# Patient Record
Sex: Male | Born: 1985 | Race: Black or African American | Hispanic: No | Marital: Single | Smoking: Current every day smoker
Health system: Southern US, Community
[De-identification: ages and names within clinical notes are randomized; demographics above are authoritative.]

## PROBLEM LIST (undated history)

## (undated) HISTORY — PX: APPENDECTOMY: SHX54

---

## 2007-12-23 ENCOUNTER — Emergency Department: Payer: Self-pay | Admitting: Emergency Medicine

## 2011-01-18 ENCOUNTER — Emergency Department: Payer: Self-pay | Admitting: Unknown Physician Specialty

## 2011-11-09 ENCOUNTER — Emergency Department: Payer: Self-pay | Admitting: Emergency Medicine

## 2011-11-09 LAB — BASIC METABOLIC PANEL
Anion Gap: 7 (ref 7–16)
BUN: 13 mg/dL (ref 7–18)
Calcium, Total: 8.9 mg/dL (ref 8.5–10.1)
Chloride: 109 mmol/L — ABNORMAL HIGH (ref 98–107)
Creatinine: 0.86 mg/dL (ref 0.60–1.30)
EGFR (African American): >60
EGFR (Non-African Amer.): >60
Glucose: 90 mg/dL (ref 65–99)
Osmolality: 283 (ref 275–301)
Potassium: 4.2 mmol/L (ref 3.5–5.1)

## 2011-11-09 LAB — TROPONIN I: Troponin-I: <0.02 ng/mL

## 2011-11-09 LAB — CBC
HCT: 44.6 % (ref 40.0–52.0)
HGB: 15 g/dL (ref 13.0–18.0)
MCH: 28 pg (ref 26.0–34.0)
Platelet: 281 10*3/uL (ref 150–440)
RBC: 5.37 10*6/uL (ref 4.40–5.90)
WBC: 7.9 10*3/uL (ref 3.8–10.6)

## 2013-07-16 ENCOUNTER — Emergency Department: Payer: Self-pay | Admitting: Emergency Medicine

## 2013-07-16 LAB — CBC
HCT: 43.4 % (ref 40.0–52.0)
HGB: 14.5 g/dL (ref 13.0–18.0)
MCH: 27.7 pg (ref 26.0–34.0)
MCHC: 33.4 g/dL (ref 32.0–36.0)
MCV: 83 fL (ref 80–100)
Platelet: 296 10*3/uL (ref 150–440)
RBC: 5.21 10*6/uL (ref 4.40–5.90)
RDW: 13.6 % (ref 11.5–14.5)
WBC: 8.9 10*3/uL (ref 3.8–10.6)

## 2013-07-16 LAB — MONONUCLEOSIS SCREEN: MONO TEST: NEGATIVE

## 2013-07-18 LAB — BETA STREP CULTURE(ARMC)

## 2014-05-04 ENCOUNTER — Emergency Department: Payer: Self-pay | Admitting: Emergency Medicine

## 2014-05-04 LAB — COMPREHENSIVE METABOLIC PANEL
AST: 20 U/L
Albumin: 4.7 g/dL
Alkaline Phosphatase: 56 U/L
Anion Gap: 8 (ref 7–16)
BUN: 10 mg/dL
Bilirubin,Total: 0.6 mg/dL
Calcium, Total: 9.4 mg/dL
Chloride: 106 mmol/L
Co2: 27 mmol/L
Creatinine: 0.73 mg/dL
Glucose: 93 mg/dL
Potassium: 3.9 mmol/L
SGPT (ALT): 22 U/L
Sodium: 141 mmol/L
TOTAL PROTEIN: 8 g/dL

## 2014-05-04 LAB — CBC WITH DIFFERENTIAL/PLATELET
BASOS ABS: 0.1 10*3/uL (ref 0.0–0.1)
Basophil %: 0.9 %
EOS PCT: 1.4 %
Eosinophil #: 0.1 10*3/uL (ref 0.0–0.7)
HCT: 46.4 % (ref 40.0–52.0)
HGB: 14.9 g/dL (ref 13.0–18.0)
Lymphocyte #: 2.4 10*3/uL (ref 1.0–3.6)
Lymphocyte %: 42.2 %
MCH: 26.9 pg (ref 26.0–34.0)
MCHC: 32.2 g/dL (ref 32.0–36.0)
MCV: 84 fL (ref 80–100)
MONOS PCT: 5.9 %
Monocyte #: 0.3 x10 3/mm (ref 0.2–1.0)
NEUTROS ABS: 2.8 10*3/uL (ref 1.4–6.5)
Neutrophil %: 49.6 %
Platelet: 289 10*3/uL (ref 150–440)
RBC: 5.55 10*6/uL (ref 4.40–5.90)
RDW: 13.6 % (ref 11.5–14.5)
WBC: 5.7 10*3/uL (ref 3.8–10.6)

## 2014-05-04 LAB — LIPASE, BLOOD: Lipase: 17 U/L — ABNORMAL LOW

## 2014-05-04 LAB — TROPONIN I: Troponin-I: <0.03 ng/mL

## 2014-09-22 ENCOUNTER — Encounter: Payer: Self-pay | Admitting: Emergency Medicine

## 2014-09-22 ENCOUNTER — Emergency Department
Admission: EM | Admit: 2014-09-22 | Discharge: 2014-09-22 | Disposition: A | Discharge status: Home / Self Care | Payer: Medicaid Other | Attending: Emergency Medicine | Admitting: Emergency Medicine

## 2014-09-22 DIAGNOSIS — Z720 Tobacco use: Secondary | ICD-10-CM | POA: Diagnosis not present

## 2014-09-22 DIAGNOSIS — R197 Diarrhea, unspecified: Secondary | ICD-10-CM | POA: Insufficient documentation

## 2014-09-22 DIAGNOSIS — R109 Unspecified abdominal pain: Secondary | ICD-10-CM | POA: Diagnosis present

## 2014-09-22 DIAGNOSIS — R1084 Generalized abdominal pain: Secondary | ICD-10-CM | POA: Insufficient documentation

## 2014-09-22 LAB — CBC WITH DIFFERENTIAL/PLATELET
Basophils Absolute: 0.1 10*3/uL (ref 0–0.1)
Basophils Relative: 1 %
Eosinophils Absolute: 0.1 10*3/uL (ref 0–0.7)
Eosinophils Relative: 2 %
HEMATOCRIT: 43.5 % (ref 40.0–52.0)
Hemoglobin: 14.2 g/dL (ref 13.0–18.0)
LYMPHS ABS: 2.6 10*3/uL (ref 1.0–3.6)
LYMPHS PCT: 38 %
MCH: 27 pg (ref 26.0–34.0)
MCHC: 32.6 g/dL (ref 32.0–36.0)
MCV: 82.8 fL (ref 80.0–100.0)
MONO ABS: 0.5 10*3/uL (ref 0.2–1.0)
MONOS PCT: 7 %
NEUTROS ABS: 3.6 10*3/uL (ref 1.4–6.5)
Neutrophils Relative %: 52 %
Platelets: 256 10*3/uL (ref 150–440)
RBC: 5.25 MIL/uL (ref 4.40–5.90)
RDW: 13.9 % (ref 11.5–14.5)
WBC: 6.9 10*3/uL (ref 3.8–10.6)

## 2014-09-22 LAB — COMPREHENSIVE METABOLIC PANEL
ALK PHOS: 50 U/L (ref 38–126)
ALT: 18 U/L (ref 17–63)
ANION GAP: 8 (ref 5–15)
AST: 17 U/L (ref 15–41)
Albumin: 4.5 g/dL (ref 3.5–5.0)
BILIRUBIN TOTAL: 0.6 mg/dL (ref 0.3–1.2)
BUN: 12 mg/dL (ref 6–20)
CALCIUM: 8.9 mg/dL (ref 8.9–10.3)
CO2: 24 mmol/L (ref 22–32)
Chloride: 102 mmol/L (ref 101–111)
Creatinine, Ser: 0.83 mg/dL (ref 0.61–1.24)
GFR calc Af Amer: >60 mL/min (ref >60)
Glucose, Bld: 87 mg/dL (ref 65–99)
POTASSIUM: 3.9 mmol/L (ref 3.5–5.1)
Sodium: 134 mmol/L — ABNORMAL LOW (ref 135–145)
TOTAL PROTEIN: 7.6 g/dL (ref 6.5–8.1)

## 2014-09-22 NOTE — Discharge Instructions (Signed)
Abdominal Pain °Many things can cause abdominal pain. Usually, abdominal pain is not caused by a disease and will improve without treatment. It can often be observed and treated at home. Your health care provider will do a physical exam and possibly order blood tests and X-rays to help determine the seriousness of your pain. However, in many cases, more time must pass before a clear cause of the pain can be found. Before that point, your health care provider may not know if you need more testing or further treatment. °HOME CARE INSTRUCTIONS  °Monitor your abdominal pain for any changes. The following actions may help to alleviate any discomfort you are experiencing: °· Only take over-the-counter or prescription medicines as directed by your health care provider. °· Do not take laxatives unless directed to do so by your health care provider. °· Try a clear liquid diet (broth, tea, or water) as directed by your health care provider. Slowly move to a bland diet as tolerated. °SEEK MEDICAL CARE IF: °· You have unexplained abdominal pain. °· You have abdominal pain associated with nausea or diarrhea. °· You have pain when you urinate or have a bowel movement. °· You experience abdominal pain that wakes you in the night. °· You have abdominal pain that is worsened or improved by eating food. °· You have abdominal pain that is worsened with eating fatty foods. °· You have a fever. °SEEK IMMEDIATE MEDICAL CARE IF:  °· Your pain does not go away within 2 hours. °· You keep throwing up (vomiting). °· Your pain is felt only in portions of the abdomen, such as the right side or the left lower portion of the abdomen. °· You pass bloody or black tarry stools. °MAKE SURE YOU: °· Understand these instructions.   °· Will watch your condition.   °· Will get help right away if you are not doing well or get worse.   °Document Released: 11/01/2004 Document Revised: 01/27/2013 Document Reviewed: 10/01/2012 °[employer]® Patient Information  ©2015 [employer]. This information is not intended to replace advice given to you by your health care provider. Make sure you discuss any questions you have with your health care provider. ° °Diarrhea °Diarrhea is frequent loose and watery bowel movements. It can cause you to feel weak and dehydrated. Dehydration can cause you to become tired and thirsty, have a dry mouth, and have decreased urination that often is dark yellow. Diarrhea is a sign of another problem, most often an infection that will not last long. In most cases, diarrhea typically lasts 2-3 days. However, it can last longer if it is a sign of something more serious. It is important to treat your diarrhea as directed by your caregiver to lessen or prevent future episodes of diarrhea. °CAUSES  °Some common causes include: °· Gastrointestinal infections caused by viruses, bacteria, or parasites. °· Food poisoning or food allergies. °· Certain medicines, such as antibiotics, chemotherapy, and laxatives. °· Artificial sweeteners and fructose. °· Digestive disorders. °HOME CARE INSTRUCTIONS °· Ensure adequate fluid intake (hydration): Have 1 cup (8 oz) of fluid for each diarrhea episode. Avoid fluids that contain simple sugars or sports drinks, fruit juices, whole milk products, and sodas. Your urine should be clear or pale yellow if you are drinking enough fluids. Hydrate with an oral rehydration solution that you can purchase at pharmacies, retail stores, and online. You can prepare an oral rehydration solution at home by mixing the following ingredients together: °¨  - tsp table salt. °¨ ¾ tsp baking soda. °¨    tsp salt substitute containing potassium chloride. °¨ 1  tablespoons sugar. °¨ 1 L (34 oz) of water. °· Certain foods and beverages may increase the speed at which food moves through the gastrointestinal (GI) tract. These foods and beverages should be avoided and include: °¨ Caffeinated and alcoholic beverages. °¨ High-fiber foods, such as raw  fruits and vegetables, nuts, seeds, and whole grain breads and cereals. °¨ Foods and beverages sweetened with sugar alcohols, such as xylitol, sorbitol, and mannitol. °· Some foods may be well tolerated and may help thicken stool including: °¨ Starchy foods, such as rice, toast, pasta, low-sugar cereal, oatmeal, grits, baked potatoes, crackers, and bagels. °¨ Bananas. °¨ Applesauce. °· Add probiotic-rich foods to help increase healthy bacteria in the GI tract, such as yogurt and fermented milk products. °· Wash your hands well after each diarrhea episode. °· Only take over-the-counter or prescription medicines as directed by your caregiver. °· Take a warm bath to relieve any burning or pain from frequent diarrhea episodes. °SEEK IMMEDIATE MEDICAL CARE IF:  °· You are unable to keep fluids down. °· You have persistent vomiting. °· You have blood in your stool, or your stools are black and tarry. °· You do not urinate in 6-8 hours, or there is only a small amount of very dark urine. °· You have abdominal pain that increases or localizes. °· You have weakness, dizziness, confusion, or light-headedness. °· You have a severe headache. °· Your diarrhea gets worse or does not get better. °· You have a fever or persistent symptoms for more than 2-3 days. °· You have a fever and your symptoms suddenly get worse. °MAKE SURE YOU:  °· Understand these instructions. °· Will watch your condition. °· Will get help right away if you are not doing well or get worse. °Document Released: 01/12/2002 Document Revised: 06/08/2013 Document Reviewed: 09/30/2011 °[employer]® Patient Information ©2015 [employer]. This information is not intended to replace advice given to you by your health care provider. Make sure you discuss any questions you have with your health care provider. ° °

## 2014-09-22 NOTE — ED Notes (Signed)
Reports nausea and diarrhea since he woke up this am

## 2014-09-22 NOTE — ED Notes (Signed)
Today c/o diarrhea, abd pain, , no vomiting, abd pain has lessened

## 2014-09-22 NOTE — ED Provider Notes (Signed)
[facility] Emergency Department Provider Note  ____________________________________________  Time seen: Approximately 6 PM  I have reviewed the triage vital signs and the nursing notes.   HISTORY  Chief Complaint Nausea and Diarrhea    HPI Roberto Barrera is a 29 y.o. male without any medical problems who presents today with abdominal cramping and 5 episodes of diarrhea. He says that he thinks several other people at work and had this similar issue over the past week. No nausea or vomiting. No recent foreign travel, drinking from streams, antibiotics. Says that the pain has reduced and has no pain currently.He says that when the pain was present was diffuse and mild. Denies any pain to his penis or testicles. Denies any dysuria.   History reviewed. No pertinent past medical history.  There are no active problems to display for this patient.   Past Surgical History  Procedure Laterality Date  . Appendectomy      No current outpatient prescriptions on file.  Allergies Review of patient's allergies indicates no known allergies.  History reviewed. No pertinent family history.  Social History Social History  Substance Use Topics  . Smoking status: Current Every Day Smoker  . Smokeless tobacco: None  . Alcohol Use: No    Review of Systems Constitutional: No fever/chills Eyes: No visual changes. ENT: No sore throat. Cardiovascular: Denies chest pain. Respiratory: Denies shortness of breath. Gastrointestinal:  No nausea, no vomiting.   No constipation. Genitourinary: Negative for dysuria. Musculoskeletal: Negative for back pain. Skin: Negative for rash. Neurological: Negative for headaches, focal weakness or numbness.  10-point ROS otherwise negative.  ____________________________________________   PHYSICAL EXAM:  VITAL SIGNS: ED Triage Vitals  Enc Vitals Group     BP 09/22/14 1503 134/83 mmHg     Pulse Rate 09/22/14 1503 76     Resp  09/22/14 1503 20     Temp 09/22/14 1503 98.6 F (37 C)     Temp Source 09/22/14 1503 Oral     SpO2 09/22/14 1503 96 %     Weight 09/22/14 1503 300 lb (136.079 kg)     Height 09/22/14 1503  (1.854 m)     Head Cir --      Peak Flow --      Pain Score 09/22/14 1505 6     Pain Loc --      Pain Edu? --      Excl. in GC? --     Constitutional: Alert and oriented. Well appearing and in no acute distress. Eyes: Conjunctivae are normal. PERRL. EOMI. Head: Atraumatic. Nose: No congestion/rhinnorhea. Mouth/Throat: Mucous membranes are moist.  Oropharynx non-erythematous. Neck: No stridor.   Cardiovascular: Normal rate, regular rhythm. Grossly normal heart sounds.  Good peripheral circulation. Respiratory: Normal respiratory effort.  No retractions. Lungs CTAB. Gastrointestinal: Soft and nontender. No distention. No abdominal bruits. No CVA tenderness. Musculoskeletal: No lower extremity tenderness nor edema.  No joint effusions. Neurologic:  Normal speech and language. No gross focal neurologic deficits are appreciated. No gait instability. Skin:  Skin is warm, dry and intact. No rash noted. Psychiatric: Mood and affect are normal. Speech and behavior are normal.  ____________________________________________   LABS (all labs ordered are listed, but only abnormal results are displayed)  Labs Reviewed  COMPREHENSIVE METABOLIC PANEL - Abnormal; Notable for the following:    Sodium 134 (*)    All other components within normal limits  CBC WITH DIFFERENTIAL/PLATELET   ____________________________________________  EKG   ____________________________________________  RADIOLOGY  ____________________________________________   PROCEDURES    ____________________________________________   INITIAL IMPRESSION / ASSESSMENT AND PLAN / ED COURSE  Pertinent labs & imaging results that were available during my care of the patient were reviewed by me and considered in my medical  decision making (see chart for details).  Patient with likely viral illness. Reassuring labs as well as history and physical. We'll discharge to home. ____________________________________________   FINAL CLINICAL IMPRESSION(S) / ED DIAGNOSES  Acute diarrhea. Acute abdominal pain. Initial visit.    Myrna Blazer, MD 09/22/14 [phone]

## 2019-10-05 ENCOUNTER — Other Ambulatory Visit: Payer: Self-pay

## 2019-10-05 ENCOUNTER — Emergency Department
Admission: EM | Admit: 2019-10-05 | Discharge: 2019-10-05 | Disposition: A | Discharge status: Home / Self Care | Payer: Medicaid Other | Attending: Emergency Medicine | Admitting: Emergency Medicine

## 2019-10-05 ENCOUNTER — Emergency Department: Payer: Medicaid Other

## 2019-10-05 ENCOUNTER — Encounter: Payer: Self-pay | Admitting: Emergency Medicine

## 2019-10-05 DIAGNOSIS — R0789 Other chest pain: Secondary | ICD-10-CM | POA: Insufficient documentation

## 2019-10-05 DIAGNOSIS — R42 Dizziness and giddiness: Secondary | ICD-10-CM | POA: Insufficient documentation

## 2019-10-05 DIAGNOSIS — R05 Cough: Secondary | ICD-10-CM | POA: Insufficient documentation

## 2019-10-05 DIAGNOSIS — R0602 Shortness of breath: Secondary | ICD-10-CM | POA: Insufficient documentation

## 2019-10-05 DIAGNOSIS — Z5321 Procedure and treatment not carried out due to patient leaving prior to being seen by health care provider: Secondary | ICD-10-CM | POA: Insufficient documentation

## 2019-10-05 LAB — COMPREHENSIVE METABOLIC PANEL
ALT: 38 U/L (ref 0–44)
AST: 21 U/L (ref 15–41)
Albumin: 4.2 g/dL (ref 3.5–5.0)
Alkaline Phosphatase: 55 U/L (ref 38–126)
Anion gap: 8 (ref 5–15)
BUN: 10 mg/dL (ref 6–20)
CO2: 26 mmol/L (ref 22–32)
Calcium: 8.7 mg/dL — ABNORMAL LOW (ref 8.9–10.3)
Chloride: 102 mmol/L (ref 98–111)
Creatinine, Ser: 0.8 mg/dL (ref 0.61–1.24)
GFR calc Af Amer: >60 mL/min (ref >60)
GFR calc non Af Amer: >60 mL/min (ref >60)
Glucose, Bld: 119 mg/dL — ABNORMAL HIGH (ref 70–99)
Potassium: 3.5 mmol/L (ref 3.5–5.1)
Sodium: 136 mmol/L (ref 135–145)
Total Bilirubin: 0.8 mg/dL (ref 0.3–1.2)
Total Protein: 8 g/dL (ref 6.5–8.1)

## 2019-10-05 LAB — CBC
HCT: 42.9 % (ref 39.0–52.0)
Hemoglobin: 14.8 g/dL (ref 13.0–17.0)
MCH: 27.7 pg (ref 26.0–34.0)
MCHC: 34.5 g/dL (ref 30.0–36.0)
MCV: 80.3 fL (ref 80.0–100.0)
Platelets: 283 10*3/uL (ref 150–400)
RBC: 5.34 MIL/uL (ref 4.22–5.81)
RDW: 13.4 % (ref 11.5–15.5)
WBC: 10.5 10*3/uL (ref 4.0–10.5)
nRBC: 0 % (ref 0.0–0.2)

## 2019-10-05 LAB — TROPONIN I (HIGH SENSITIVITY): Troponin I (High Sensitivity): 9 ng/L (ref <18)

## 2019-10-05 NOTE — ED Notes (Signed)
No answer when called several times from lobby 

## 2019-10-05 NOTE — ED Triage Notes (Signed)
Here for [facility] and difficulty taking deep breath. Has also had pain in chest but feels it is from coughing. Low grade temp in triage.  unlabored at this time.  Had covid test done but waiting on results.  NAD at this time.

## 2019-10-06 ENCOUNTER — Telehealth: Payer: Self-pay | Admitting: Emergency Medicine

## 2019-10-06 NOTE — Telephone Encounter (Signed)
Called patient due to lwot to inquire about condition and follow up plans. He says he is a little better.  I told him that cxr is suspicious for pneumonia and that he really needs to have exam in order to determine what it is.  I told him about urgent cares and that they could see him for this.  He says he will go to urgent care.

## 2022-03-14 IMAGING — CR DG CHEST 2V
1 series · 2 of 2 positions shown · non-contrast
Comparison: 11/09/2011

CLINICAL DATA: Cough, shortness of breath

EXAM:
CHEST - 2 VIEW

[Series 1: dg chest 2 view · 0.14mm/px · 2 of 2 slices shown]
[im 1/2]
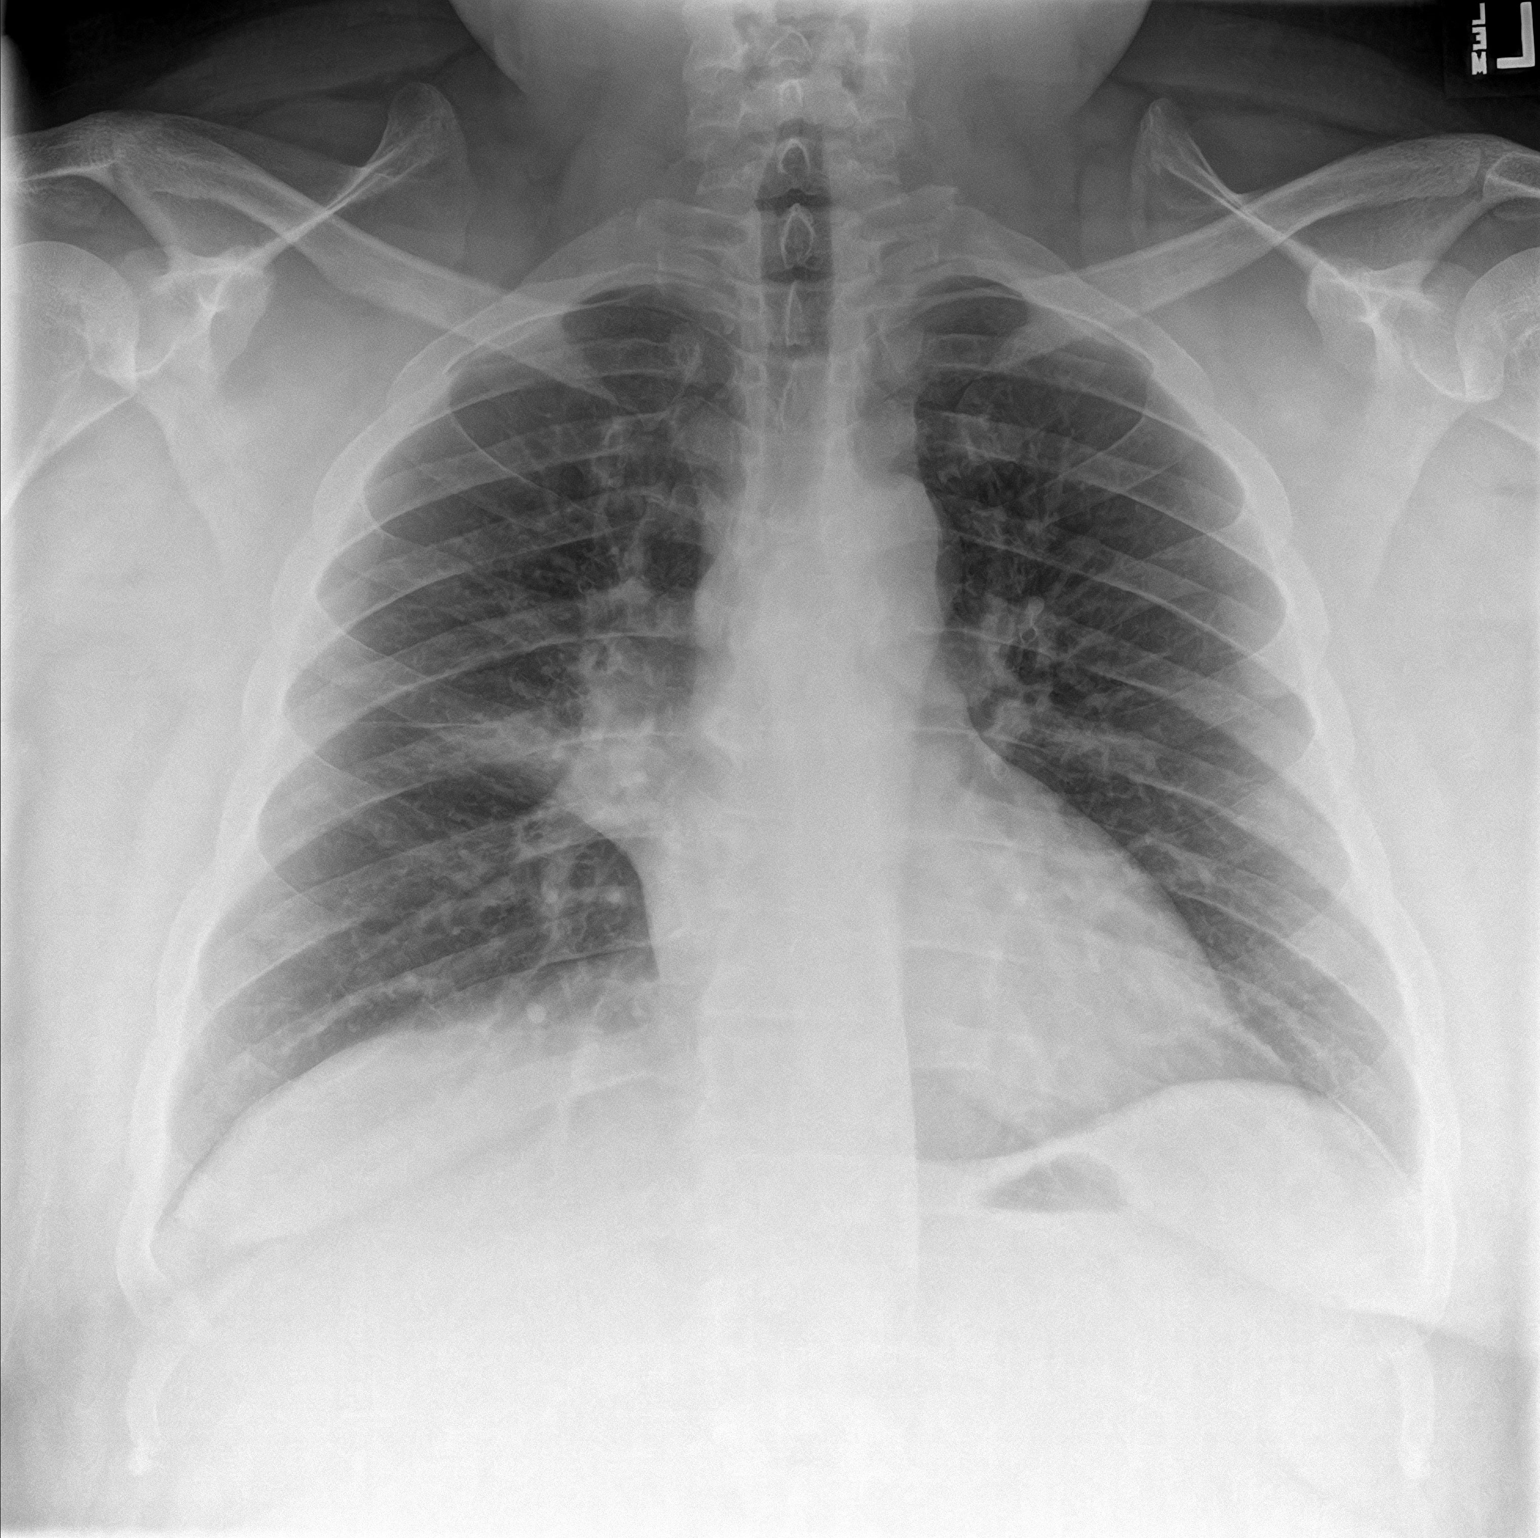
[im 2/2]
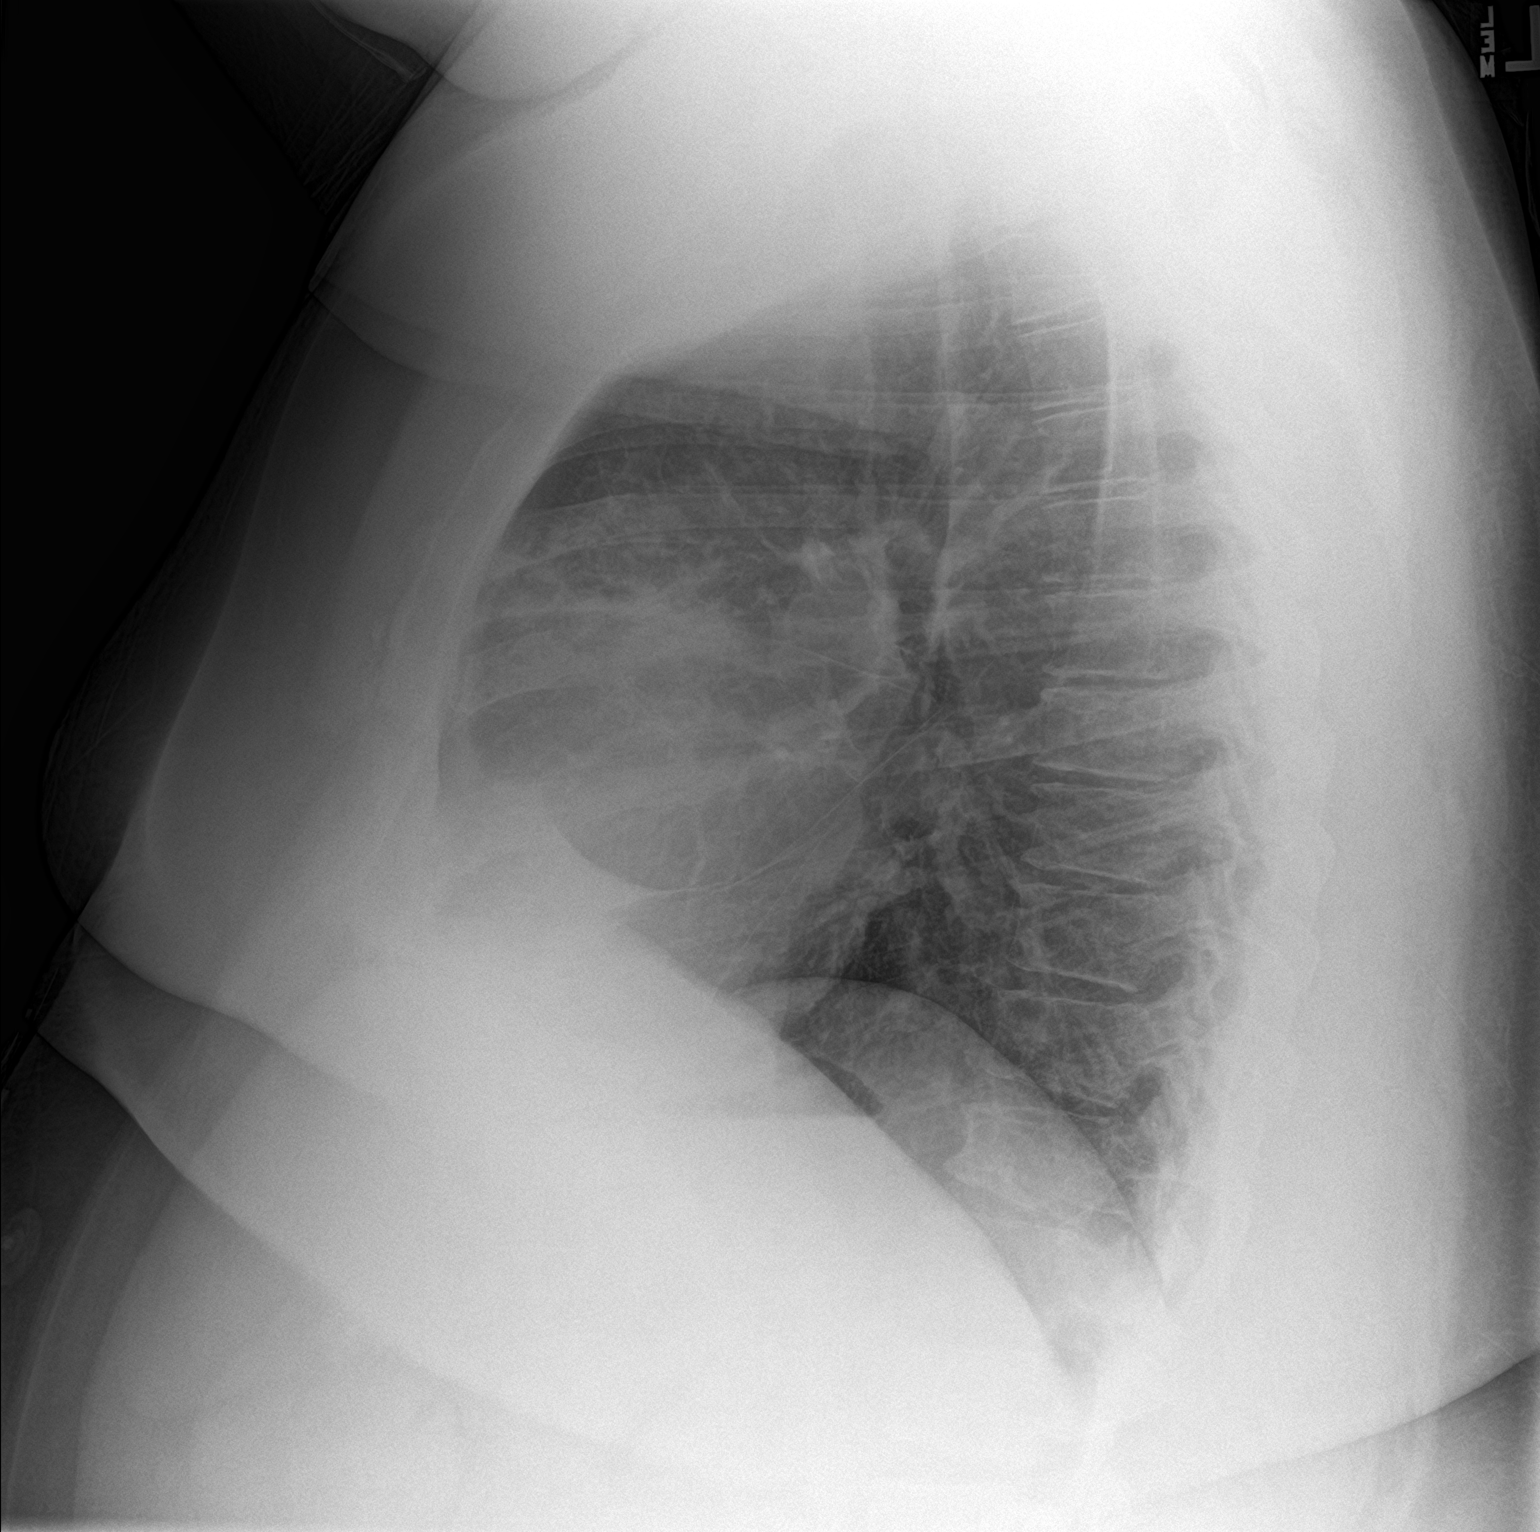

[2 of 2 positions shown; findings below may reference images not displayed]

FINDINGS: The heart size and mediastinal contours are within normal limits.
Streaky right perihilar airspace opacity. Left lung appears clear.
No pleural effusion or pneumothorax. The visualized skeletal
structures are unremarkable.
IMPRESSION: Streaky right perihilar airspace opacity suspicious for pneumonia.
